# Patient Record
Sex: Female | Born: 2000
Health system: Southern US, Community
[De-identification: ages and names within clinical notes are randomized; demographics above are authoritative.]

## PROBLEM LIST (undated history)

## (undated) DIAGNOSIS — F419 Anxiety disorder, unspecified: Secondary | ICD-10-CM

## (undated) DIAGNOSIS — K219 Gastro-esophageal reflux disease without esophagitis: Secondary | ICD-10-CM

## (undated) HISTORY — DX: Gastro-esophageal reflux disease without esophagitis: K21.9

## (undated) HISTORY — DX: Anxiety disorder, unspecified: F41.9

---
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
## 2022-04-19 ENCOUNTER — Telehealth: Payer: Self-pay | Admitting: Pediatrics

## 2022-04-19 NOTE — Telephone Encounter (Signed)
[name] (mother) called stating that the pt would like to establish care with Dr. Etter Sjogren. [name] is a current pt of Dr. Etter Sjogren. Please Advise.

## 2022-04-21 ENCOUNTER — Ambulatory Visit: Payer: 59 | Admitting: Family Medicine

## 2022-04-24 ENCOUNTER — Encounter: Payer: Self-pay | Admitting: Family Medicine

## 2022-04-24 ENCOUNTER — Ambulatory Visit: Payer: 59 | Admitting: Family Medicine

## 2022-04-24 ENCOUNTER — Other Ambulatory Visit (HOSPITAL_COMMUNITY): Payer: Self-pay

## 2022-04-24 VITALS — BP 96/70 | HR 78 | Temp 97.8°F | Resp 18 | Ht 66.34 in | Wt 132.6 lb

## 2022-04-24 DIAGNOSIS — F419 Anxiety disorder, unspecified: Secondary | ICD-10-CM | POA: Insufficient documentation

## 2022-04-24 DIAGNOSIS — Z23 Encounter for immunization: Secondary | ICD-10-CM | POA: Diagnosis not present

## 2022-04-24 DIAGNOSIS — Z0000 Encounter for general adult medical examination without abnormal findings: Secondary | ICD-10-CM | POA: Diagnosis not present

## 2022-04-24 MED ORDER — SERTRALINE HCL 50 MG PO TABS
50.0000 mg | ORAL_TABLET | Freq: Every day | ORAL | 1 refills | Status: DC
  Filled 2022-04-24: qty 90, 90d supply, fill #0

## 2022-04-24 MED ORDER — SERTRALINE HCL 50 MG PO TABS: 50.0000 mg | ORAL_TABLET | Freq: Every day | ORAL | 1 refills | Status: DC

## 2022-04-24 NOTE — Assessment & Plan Note (Signed)
Ghm utd Check labs  See AVS  

## 2022-04-24 NOTE — Assessment & Plan Note (Signed)
Cont zoloft 

## 2022-04-24 NOTE — Patient Instructions (Signed)

## 2022-04-24 NOTE — Progress Notes (Signed)
Subjective:   By signing my name below, I, Eugene Gavia, attest that this documentation has been prepared under the direction and in the presence of Ann Held, DO 04/24/22   Patient ID: Stacy Nelson, female    DOB: 2000/11/15, 22 y.o.   MRN: [id number]  Chief Complaint  Patient presents with   New Patient (Initial Visit)    Pt states here to get established and refills.    HPI Patient is in today for a new patient visit to establish care. She is studying biology as a Administrator, arts at [employer]. She would like to go to PA school and work in pediatrics.  She states that she has been on Zoloft 50mg  daily for 1.5 years and has worked very well for her. This is used to treat her anxiety which she states started in her senior year of high school. She would like a refill of this today. She has never gone to counseling for her anxiety but feels that it is well controlled.  She reports that she is moderately active and walks daily. She feels that her diet is well balanced.  Past Medical History:  Diagnosis Date   Anxiety    Constipation    Gastroesophageal reflux     History reviewed. No pertinent surgical history.  Family History  Problem Relation Age of Onset   GER disease Maternal Uncle    GER disease Maternal Uncle     Social History   Socioeconomic History   Marital status: Single    Spouse name: Not on file   Number of children: Not on file   Years of education: Not on file   Highest education level: Not on file  Occupational History   Occupation: student [employer]  Tobacco Use   Smoking status: Never   Smokeless tobacco: Never  Vaping Use   Vaping Use: Never used  Substance and Sexual Activity   Alcohol use: Yes    Comment: occassionally   Drug use: Never   Sexual activity: Yes    Birth control/protection: I.U.D.  Other Topics Concern   Not on file  Social History Narrative   Sophomore at [employer]---- biology major   Wants to go to PA  school    Social Determinants of Health   Financial Resource Strain: Not on file  Food Insecurity: Not on file  Transportation Needs: Not on file  Physical Activity: Not on file  Stress: Not on file  Social Connections: Not on file  Intimate Partner Violence: Not on file    Outpatient Medications Prior to Visit  Medication Sig Dispense Refill   ibuprofen (ADVIL,MOTRIN) 200 MG tablet Take 200 mg by mouth every 6 (six) hours as needed.     sertraline (ZOLOFT) 50 MG tablet Take 50 mg by mouth daily.     diphenhydrAMINE (SOMINEX) 25 MG tablet Take 25 mg by mouth at bedtime as needed.   (Patient not taking: Reported on 04/24/2022)     Rachel 2 Units by mouth daily.   (Patient not taking: Reported on 04/24/2022)     loratadine (CLARITIN REDITABS) 10 MG dissolvable tablet Take 10 mg by mouth daily.   (Patient not taking: Reported on 04/24/2022)     meloxicam (MOBIC) 15 MG tablet Take one a day for 7 days then prn (Patient not taking: Reported on 04/24/2022) 40 tablet 0   NEXIUM 40 MG capsule SPINKLE 1 CAPSULE IN SOFT FOOD AND TAKE BY MOUTH  ONCE DAILY (Patient not taking: Reported on 04/24/2022) 30 capsule 5   pantoprazole (PROTONIX) 40 MG tablet Take 40 mg by mouth daily. (Patient not taking: Reported on 04/24/2022)     No facility-administered medications prior to visit.    No Known Allergies  Review of Systems  Constitutional:  Negative for fever and malaise/fatigue.  HENT:  Negative for congestion, sinus pain and sore throat.   Eyes:  Negative for blurred vision.  Respiratory:  Negative for cough, shortness of breath and wheezing.   Cardiovascular:  Negative for chest pain, palpitations and leg swelling.  Gastrointestinal:  Negative for abdominal pain, blood in stool, constipation, diarrhea, nausea and vomiting.  Genitourinary:  Negative for dysuria, frequency and hematuria.  Musculoskeletal:  Negative for falls, joint pain and myalgias.  Skin:  Negative for rash.   Neurological:  Negative for dizziness, loss of consciousness and headaches.  Endo/Heme/Allergies:  Negative for environmental allergies.  Psychiatric/Behavioral:  Negative for depression. The patient is not nervous/anxious.        Objective:    Physical Exam Vitals and nursing note reviewed.  Constitutional:      General: She is not in acute distress.    Appearance: Normal appearance. She is well-developed. She is not ill-appearing.  HENT:     Head: Normocephalic and atraumatic.     Right Ear: External ear normal.     Left Ear: External ear normal.  Eyes:     Extraocular Movements: Extraocular movements intact.     Conjunctiva/sclera: Conjunctivae normal.     Pupils: Pupils are equal, round, and reactive to light.  Neck:     Thyroid: No thyromegaly.     Vascular: No carotid bruit or JVD.  Cardiovascular:     Rate and Rhythm: Normal rate and regular rhythm.     Heart sounds: Normal heart sounds. No murmur heard.    No gallop.  Pulmonary:     Effort: Pulmonary effort is normal. No respiratory distress.     Breath sounds: Normal breath sounds. No wheezing or rales.  Chest:     Chest wall: No tenderness.  Musculoskeletal:     Cervical back: Normal range of motion and neck supple.  Skin:    General: Skin is warm and dry.  Neurological:     Mental Status: She is alert and oriented to person, place, and time.  Psychiatric:        Judgment: Judgment normal.     BP 96/70 (BP Location: Left Arm, Patient Position: Sitting, Cuff Size: Normal)   Pulse 78   Temp 97.8 F (36.6 C) (Oral)   Resp 18   Ht 5' 6.34" (1.685 m)   Wt 132 lb 9.6 oz (60.1 kg)   LMP 04/18/2022   SpO2 99%   BMI 21.18 kg/m  Wt Readings from Last 3 Encounters:  04/24/22 132 lb 9.6 oz (60.1 kg) (58 %, Z= 0.20)*  03/31/18 137 lb (62.1 kg) (78 %, Z= 0.78)*  03/22/17 131 lb 3.2 oz (59.5 kg) (77 %, Z= 0.73)*   * Growth percentiles are based on CDC (Girls, 2-20 Years) data.       Assessment & Plan:   Preventative health care Assessment & Plan: Ghm utd Check labs  See AVS   Orders: -     CBC with Differential/Platelet -     Comprehensive metabolic panel -     Lipid panel -     TSH  Anxiety Assessment & Plan: Con't zoloft   Orders: -  Sertraline HCl; Take 1 tablet (50 mg total) by mouth daily.  Dispense: 90 tablet; Refill: 1  Need for influenza vaccination -     Flu Vaccine QUAD 64mo+IM (Fluarix, Fluzone & Alfiuria Quad PF)     I,Alexis Herring,acting as a scribe for [employer], DO.,have documented all relevant documentation on the behalf of Donato Schultz, DO,as directed by  Donato Schultz, DO while in the presence of Donato Schultz, DO.   I, Donato Schultz, DO, personally preformed the services described in this documentation.  All medical record entries made by the scribe were at my direction and in my presence.  I have reviewed the chart and discharge instructions (if applicable) and agree that the record reflects my personal performance and is accurate and complete. 04/24/22   Donato Schultz, DO

## 2022-04-25 ENCOUNTER — Encounter: Payer: Self-pay | Admitting: Family Medicine

## 2022-04-25 LAB — LIPID PANEL
Cholesterol: 184 mg/dL (ref 0–200)
HDL: 82.9 mg/dL (ref >39.00)
LDL Cholesterol: 94 mg/dL (ref 0–99)
NonHDL: 101.44
Total CHOL/HDL Ratio: 2
Triglycerides: 39 mg/dL (ref 0.0–149.0)
VLDL: 7.8 mg/dL (ref 0.0–40.0)

## 2022-04-25 LAB — COMPREHENSIVE METABOLIC PANEL
ALT: 9 U/L (ref 0–35)
AST: 17 U/L (ref 0–37)
Albumin: 4.5 g/dL (ref 3.5–5.2)
Alkaline Phosphatase: 50 U/L (ref 47–119)
BUN: 8 mg/dL (ref 6–23)
CO2: 28 mEq/L (ref 19–32)
Calcium: 9.6 mg/dL (ref 8.4–10.5)
Chloride: 104 mEq/L (ref 96–112)
Creatinine, Ser: 0.89 mg/dL (ref 0.40–1.20)
GFR: 93.69 mL/min (ref >60.00)
Glucose, Bld: 84 mg/dL (ref 70–99)
Potassium: 4.1 mEq/L (ref 3.5–5.1)
Sodium: 141 mEq/L (ref 135–145)
Total Bilirubin: 0.7 mg/dL (ref 0.2–1.2)
Total Protein: 7.2 g/dL (ref 6.0–8.3)

## 2022-04-25 LAB — CBC WITH DIFFERENTIAL/PLATELET
Basophils Absolute: 0.1 10*3/uL (ref 0.0–0.1)
Basophils Relative: 1.1 % (ref 0.0–3.0)
Eosinophils Absolute: 0 10*3/uL (ref 0.0–0.7)
Eosinophils Relative: 0.7 % (ref 0.0–5.0)
HCT: 40.4 % (ref 36.0–49.0)
Hemoglobin: 13.5 g/dL (ref 12.0–16.0)
Lymphocytes Relative: 16.5 % — ABNORMAL LOW (ref 24.0–48.0)
Lymphs Abs: 1.1 10*3/uL (ref 0.7–4.0)
MCHC: 33.4 g/dL (ref 31.0–37.0)
MCV: 91.2 fl (ref 78.0–98.0)
Monocytes Absolute: 0.5 10*3/uL (ref 0.1–1.0)
Monocytes Relative: 7.4 % (ref 3.0–12.0)
Neutro Abs: 4.8 10*3/uL (ref 1.4–7.7)
Neutrophils Relative %: 74.3 % — ABNORMAL HIGH (ref 43.0–71.0)
Platelets: 307 10*3/uL (ref 150.0–575.0)
RBC: 4.43 Mil/uL (ref 3.80–5.70)
RDW: 14.3 % (ref 11.4–15.5)
WBC: 6.5 10*3/uL (ref 4.5–13.5)

## 2022-04-25 LAB — TSH: TSH: 1.66 u[IU]/mL (ref 0.40–5.00)

## 2022-04-26 NOTE — Progress Notes (Signed)
Letter mailed out. Done 

## 2023-03-03 ENCOUNTER — Other Ambulatory Visit: Payer: Self-pay | Admitting: Family Medicine

## 2023-03-03 DIAGNOSIS — F419 Anxiety disorder, unspecified: Secondary | ICD-10-CM
